# Patient Record
Sex: Female | Born: 1950 | Race: White | Hispanic: No | Marital: Married | Smoking: Never smoker
Health system: Southern US, Community
[De-identification: ages and names within clinical notes are randomized; demographics above are authoritative.]

## PROBLEM LIST (undated history)

## (undated) DIAGNOSIS — F419 Anxiety disorder, unspecified: Secondary | ICD-10-CM

## (undated) DIAGNOSIS — I10 Essential (primary) hypertension: Secondary | ICD-10-CM

## (undated) HISTORY — PX: TONSILLECTOMY: SUR1361

## (undated) HISTORY — PX: CHOLECYSTECTOMY: SHX55

## (undated) HISTORY — PX: TUBAL LIGATION: SHX77

---

## 2004-12-18 ENCOUNTER — Emergency Department (HOSPITAL_COMMUNITY): Admission: EM | Admit: 2004-12-18 | Discharge: 2004-12-18 | Payer: Self-pay | Admitting: Emergency Medicine

## 2006-02-16 ENCOUNTER — Encounter: Admission: RE | Admit: 2006-02-16 | Discharge: 2006-02-16 | Payer: Self-pay | Admitting: Family Medicine

## 2006-06-08 ENCOUNTER — Emergency Department: Payer: Self-pay | Admitting: General Practice

## 2008-08-24 IMAGING — CT CT STONE STUDY
1 of 2 series · 15 of 32 positions shown, 19 images · non-contrast
Comparison: none

REASON FOR EXAM: abd pain pt in rm # 7
COMMENTS:

PROCEDURE:     CT  - CT ABDOMEN /PELVIS WO (STONE)  - June 08, 2006  [DATE]
RESULT:
HISTORY: Stone.

[Series 2: stone · axial · 0.84mm/px · z∈[-704,-272]mm · 15 of 158 slices shown, 19 images]
[im 7/158  soft-tissue]
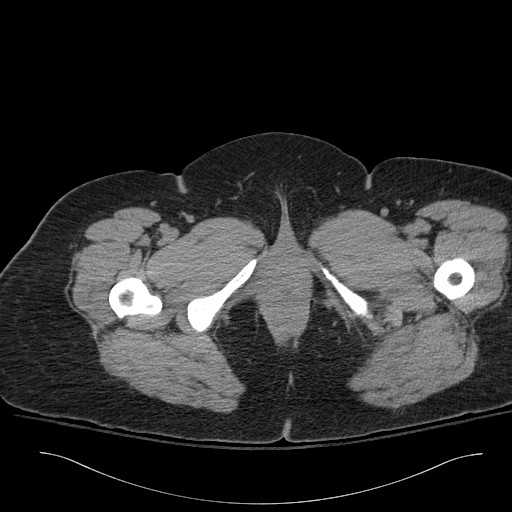
[im 7/158  bone]
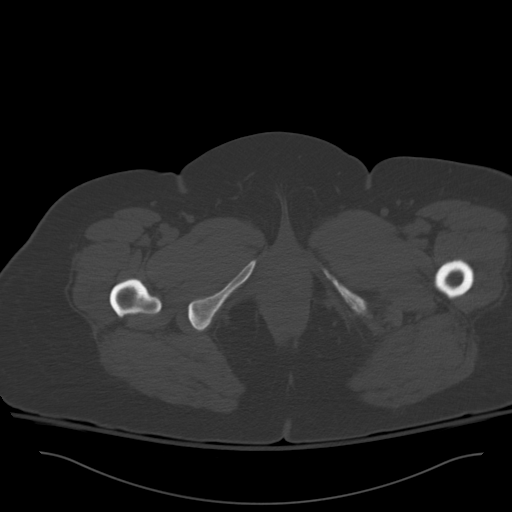
[im 20/158  soft-tissue]
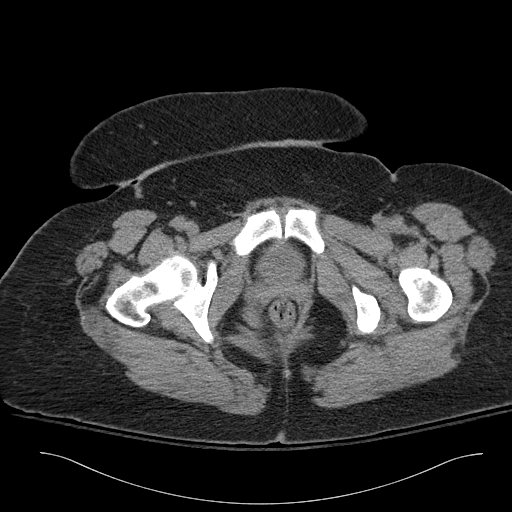
[im 33/158  soft-tissue]
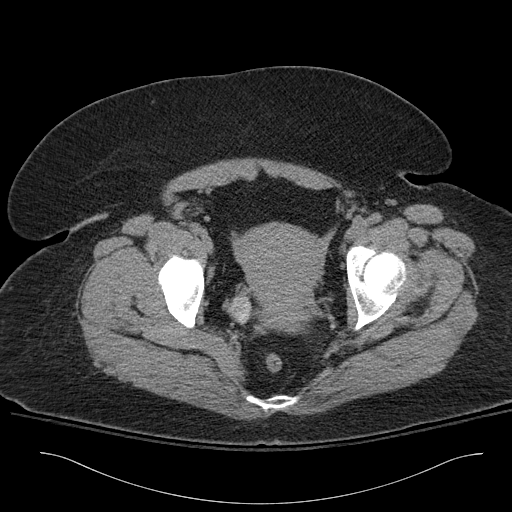
[im 46/158  soft-tissue]
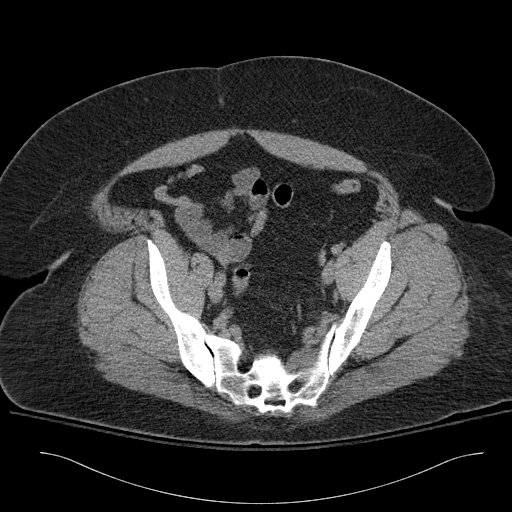
[im 53/158  soft-tissue]
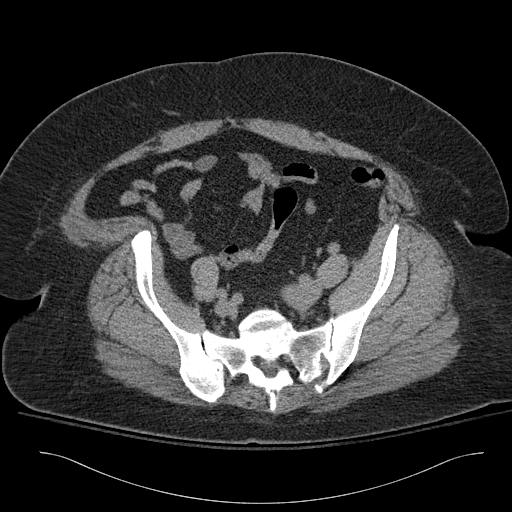
[im 66/158  soft-tissue]
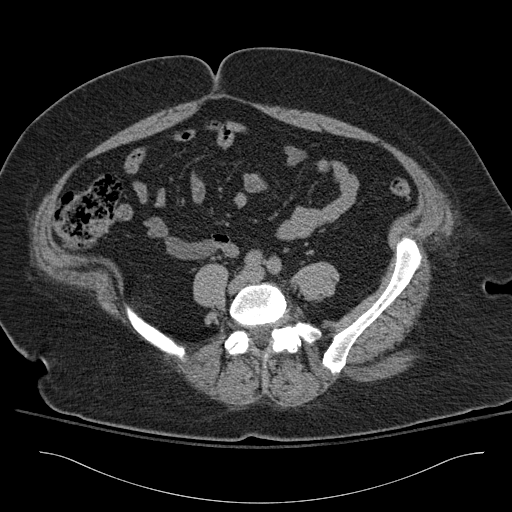
[im 79/158  soft-tissue]
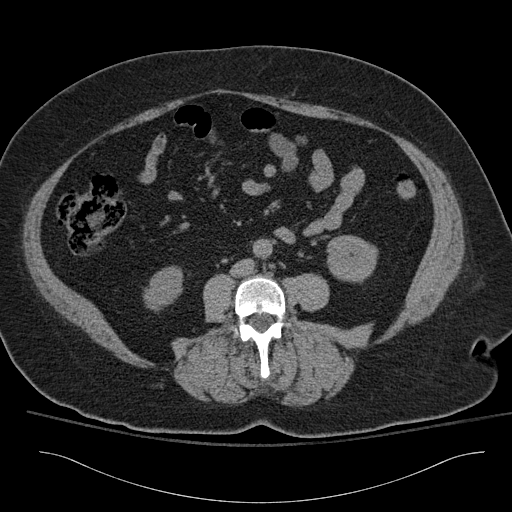
[im 92/158  soft-tissue]
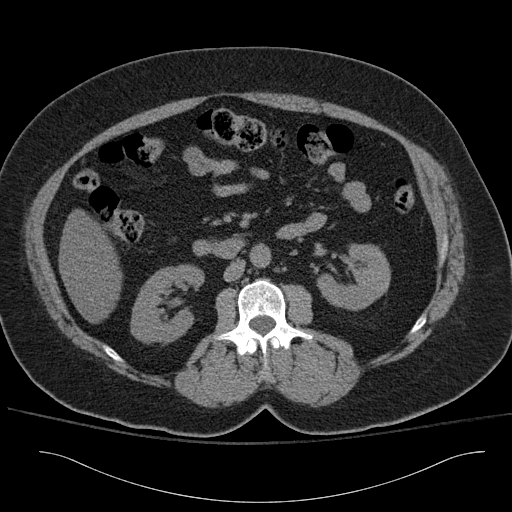
[im 105/158  soft-tissue]
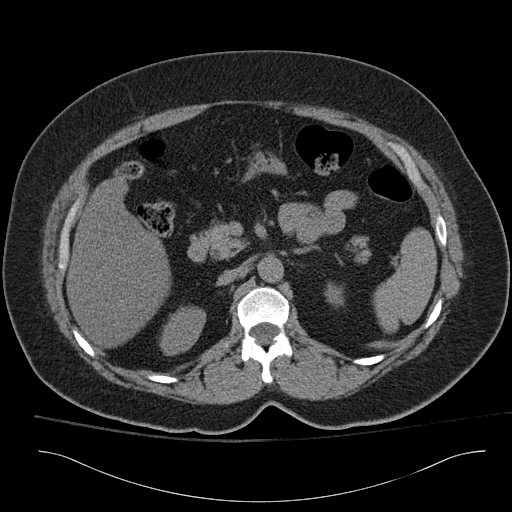
[im 105/158  bone]
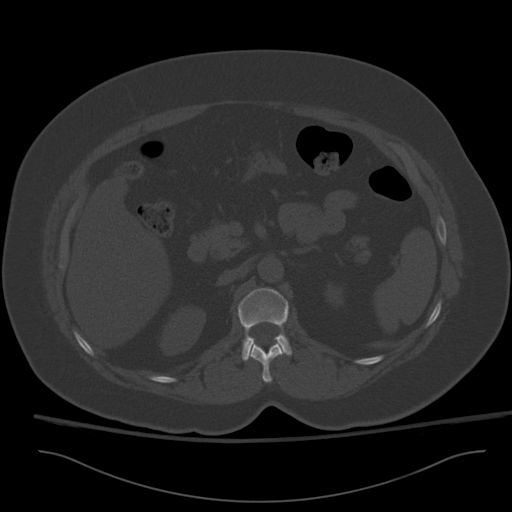
[im 112/158  soft-tissue]
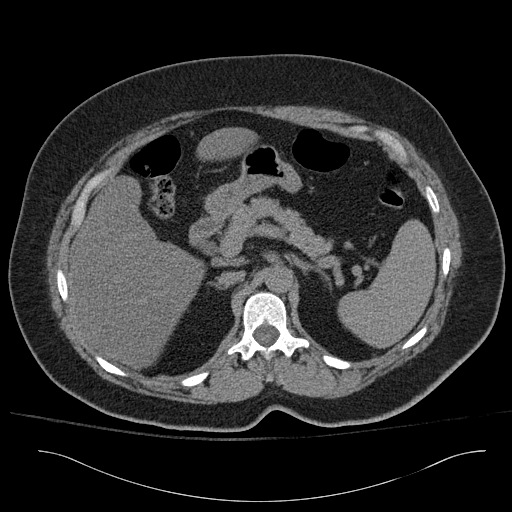
[im 125/158  soft-tissue]
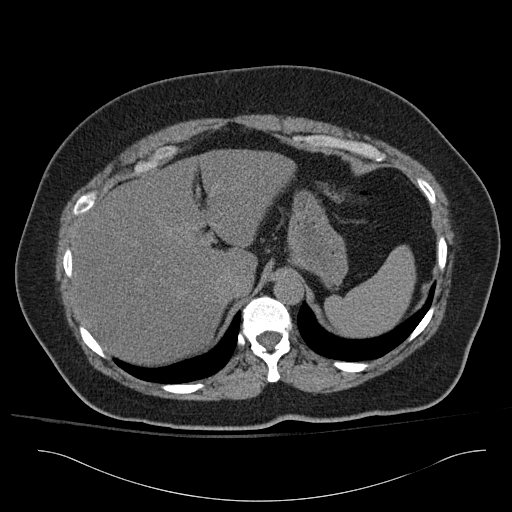
[im 131/158  lung]
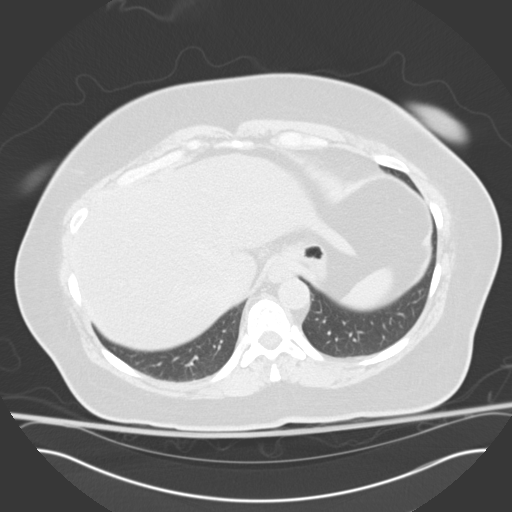
[im 138/158  soft-tissue]
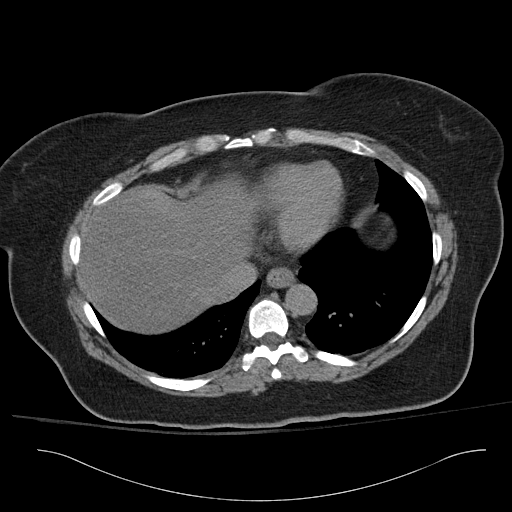
[im 138/158  lung]
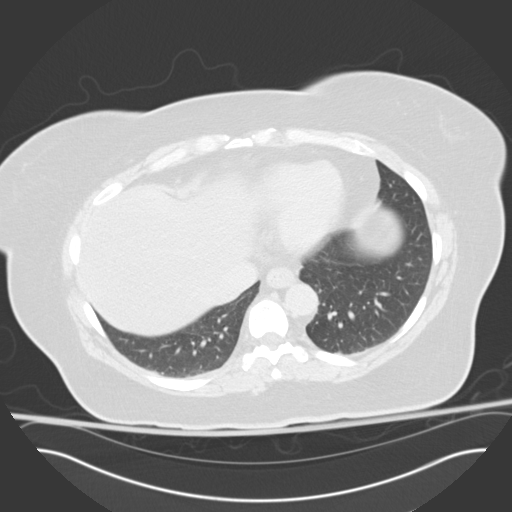
[im 144/158  lung]
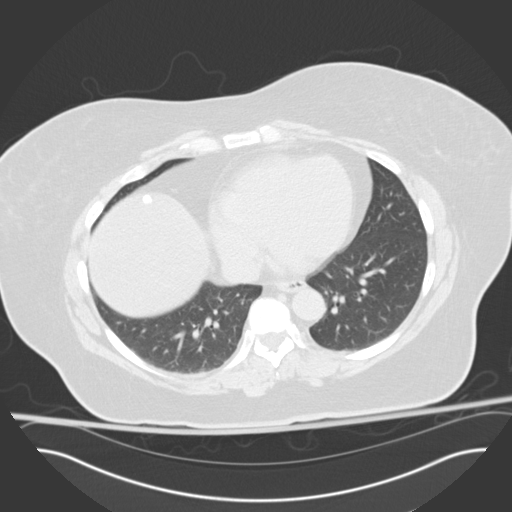
[im 151/158  soft-tissue]
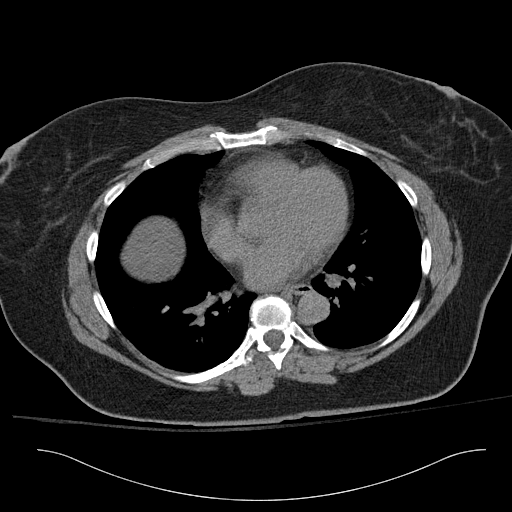
[im 151/158  lung]
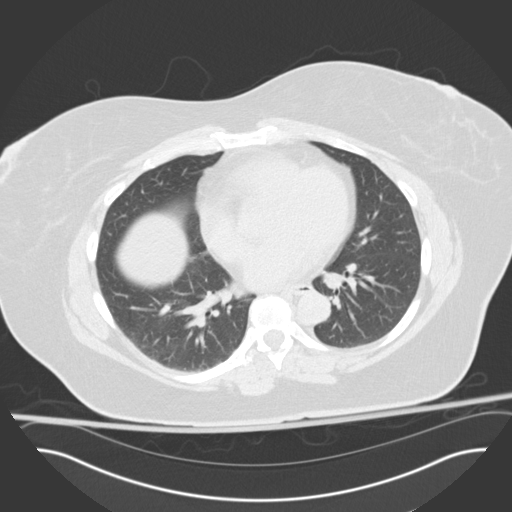

[15 of 32 positions shown; findings below may reference images not displayed]

PROCEDURE AND FINDINGS:   Nonenhanced CT of the abdomen and pelvis were
obtained. The liver is fatty replaced.  The spleen is normal. The pancreas
is normal.  The adrenals are unremarkable.  No focal significant abnormality
is identified.  Tiny cysts are noted in the anterior aspect of the LEFT
kidney. Surgical clips are noted in the region of the gallbladder fossa.
The RIGHT lower quadrant is unremarkable.  This includes the appendix.
There is no bowel distention.  There is a RIGHT adnexal cyst. Calcified
pelvic phleboliths are noted.  No hydronephrosis or hydroureter is noted.
LEFT adnexal cyst is also incidentally noted.  We could perform pelvic
ultrasound for further evaluation if clinically indicated.
IMPRESSION: 1.     Bilateral adnexal cysts.
2.     No evidence of obstructing ureteral stone, hydronephrosis or
hydroureter.
3.     Aortoiliac atherosclerotic vascular disease is incidentally noted.
4.     The patient has had a prior cholecystectomy.
5.     Calcified density is noted in the RIGHT lobe of the liver
incidentally. This is most likely a granuloma or surgical clip.
6.     Simple cyst LEFT kidney.

## 2013-08-28 ENCOUNTER — Telehealth (HOSPITAL_COMMUNITY): Payer: Self-pay | Admitting: *Deleted

## 2013-08-28 NOTE — Telephone Encounter (Signed)
Telephoned patient at home # and left message to return call to [facility] 

## 2013-08-29 ENCOUNTER — Other Ambulatory Visit: Payer: Self-pay | Admitting: Obstetrics and Gynecology

## 2013-08-29 DIAGNOSIS — R921 Mammographic calcification found on diagnostic imaging of breast: Secondary | ICD-10-CM

## 2013-09-03 ENCOUNTER — Ambulatory Visit
Admission: RE | Admit: 2013-09-03 | Discharge: 2013-09-03 | Disposition: A | Discharge status: Home / Self Care | Payer: No Typology Code available for payment source | Source: Ambulatory Visit | Attending: Obstetrics and Gynecology | Admitting: Obstetrics and Gynecology

## 2013-09-03 ENCOUNTER — Encounter (HOSPITAL_COMMUNITY): Payer: Self-pay

## 2013-09-03 ENCOUNTER — Ambulatory Visit (HOSPITAL_COMMUNITY)
Admission: RE | Admit: 2013-09-03 | Discharge: 2013-09-03 | Disposition: A | Discharge status: Home / Self Care | Payer: No Typology Code available for payment source | Source: Ambulatory Visit | Attending: Obstetrics and Gynecology | Admitting: Obstetrics and Gynecology

## 2013-09-03 VITALS — BP 136/82 | Temp 97.7°F | Ht 62.0 in | Wt 238.6 lb

## 2013-09-03 DIAGNOSIS — Z01419 Encounter for gynecological examination (general) (routine) without abnormal findings: Secondary | ICD-10-CM

## 2013-09-03 DIAGNOSIS — R921 Mammographic calcification found on diagnostic imaging of breast: Secondary | ICD-10-CM

## 2013-09-03 HISTORY — DX: Essential (primary) hypertension: I10

## 2013-09-03 HISTORY — DX: Anxiety disorder, unspecified: F41.9

## 2013-09-03 NOTE — Progress Notes (Signed)
No complaints today. Patient referred to [facility] by [facility] due to recommending a right breast biopsy. Diagnostic Mammogram and Ultrasound completed at [facility]. Patient has taken images to the [facility] [place].  Pap Smear:  Completed Pap smear today. Last Pap smear was over 3 years ago in [place] and normal per patient. Per patient has no history of an abnormal Pap smear.  No Pap smear results in EPIC.  Physical exam: Breasts Breasts symmetrical. No skin abnormalities bilateral breasts. No nipple retraction bilateral breasts. No nipple discharge bilateral breasts. No lymphadenopathy. No lumps palpated bilateral breasts. No complaints of pain or tenderness on exam. Referred patient to the [facility] for right breast biopsy per recommendation. Appointment scheduled for Tuesday, September 03, 2013 at 1230.           Pelvic/Bimanual   Ext Genitalia No lesions, no swelling and no discharge observed on external genitalia.         Vagina Vagina pink and normal texture. No lesions or discharge observed in vagina.          Cervix Cervix is present. Cervix pink and of normal texture. Small amount of white discharge discharge observed on cervix.        Uterus Uterus is present and palpable. Uterus in normal position and normal size.       Adnexae Bilateral ovaries present and unable to palpate. No tenderness on palpation.        Rectovaginal No rectal exam completed today since patient had no rectal complaints. No skin abnormalities observed on exam.

## 2013-09-03 NOTE — Patient Instructions (Signed)
Explained to [employer] that [facility] will cover Pap smears and co-testing every 5 years unless has a history of abnormal Pap smears. Referred patient to the [facility] for right breast biopsy per recommendation. Appointment scheduled for Tuesday, September 03, 2013 at 1230. Patient aware of appointment and will be there. Let patient know will follow up with her within the next couple weeks with results of Pap smear by phone. Evan A Velarde verbalized understanding.  Makya Yurko, Kathaleen Maser, RN 8:44 AM

## 2013-09-06 LAB — CYTOLOGY - PAP

## 2013-09-10 ENCOUNTER — Ambulatory Visit (HOSPITAL_COMMUNITY): Payer: Self-pay

## 2013-09-13 ENCOUNTER — Telehealth (HOSPITAL_COMMUNITY): Payer: Self-pay | Admitting: *Deleted

## 2013-09-13 NOTE — Telephone Encounter (Signed)
Telephoned patient at home # and left message to return call to [facility] 

## 2013-09-27 ENCOUNTER — Telehealth (HOSPITAL_COMMUNITY): Payer: Self-pay | Admitting: *Deleted

## 2013-09-27 NOTE — Telephone Encounter (Signed)
Patient returned call. Advised patient of negative pap smear results. Next pap smear due in 3 years. Patient voiced understanding. Patient received bills in mail and will fax copies to office. Fax number given to patient.

## 2013-11-11 ENCOUNTER — Encounter (HOSPITAL_COMMUNITY): Payer: Self-pay

## 2017-01-24 ENCOUNTER — Encounter (HOSPITAL_COMMUNITY): Payer: Self-pay
